# Patient Record
Sex: Male | Born: 1960 | Race: White | Hispanic: No | Marital: Married | Smoking: Never smoker
Health system: Southern US, Community
[De-identification: ages and names within clinical notes are randomized; demographics above are authoritative.]

## PROBLEM LIST (undated history)

## (undated) DIAGNOSIS — Z789 Other specified health status: Secondary | ICD-10-CM

## (undated) HISTORY — DX: Other specified health status: Z78.9

---

## 2008-09-23 ENCOUNTER — Encounter: Admission: RE | Admit: 2008-09-23 | Discharge: 2008-09-23 | Payer: Self-pay | Admitting: Internal Medicine

## 2008-09-23 ENCOUNTER — Encounter: Payer: Self-pay | Admitting: Internal Medicine

## 2008-10-13 ENCOUNTER — Ambulatory Visit: Payer: Self-pay | Admitting: Internal Medicine

## 2008-10-13 ENCOUNTER — Encounter: Payer: Self-pay | Admitting: Internal Medicine

## 2008-10-19 ENCOUNTER — Telehealth: Payer: Self-pay | Admitting: Internal Medicine

## 2008-10-20 ENCOUNTER — Telehealth (INDEPENDENT_AMBULATORY_CARE_PROVIDER_SITE_OTHER): Payer: Self-pay | Admitting: *Deleted

## 2008-10-21 DIAGNOSIS — J45909 Unspecified asthma, uncomplicated: Secondary | ICD-10-CM | POA: Insufficient documentation

## 2008-10-27 ENCOUNTER — Telehealth (INDEPENDENT_AMBULATORY_CARE_PROVIDER_SITE_OTHER): Payer: Self-pay | Admitting: *Deleted

## 2008-10-28 ENCOUNTER — Ambulatory Visit: Payer: Self-pay | Admitting: Internal Medicine

## 2008-10-31 ENCOUNTER — Telehealth: Payer: Self-pay | Admitting: Pulmonary Disease

## 2008-10-31 LAB — CONVERTED CEMR LAB: TSH: 1.63 microintl units/mL (ref 0.35–5.50)

## 2008-11-15 ENCOUNTER — Encounter: Payer: Self-pay | Admitting: Internal Medicine

## 2008-12-01 ENCOUNTER — Telehealth: Payer: Self-pay | Admitting: Internal Medicine

## 2008-12-07 ENCOUNTER — Ambulatory Visit: Payer: Self-pay | Admitting: Internal Medicine

## 2011-02-19 HISTORY — PX: COLONOSCOPY: SHX174

## 2011-12-21 ENCOUNTER — Emergency Department (HOSPITAL_COMMUNITY): Payer: BC Managed Care – PPO

## 2011-12-21 ENCOUNTER — Encounter (HOSPITAL_COMMUNITY): Payer: Self-pay | Admitting: *Deleted

## 2011-12-21 ENCOUNTER — Emergency Department (HOSPITAL_COMMUNITY)
Admission: EM | Admit: 2011-12-21 | Discharge: 2011-12-21 | Disposition: A | Discharge status: Home / Self Care | Payer: BC Managed Care – PPO | Attending: Emergency Medicine | Admitting: Emergency Medicine

## 2011-12-21 DIAGNOSIS — W278XXA Contact with other nonpowered hand tool, initial encounter: Secondary | ICD-10-CM | POA: Insufficient documentation

## 2011-12-21 DIAGNOSIS — Y9389 Activity, other specified: Secondary | ICD-10-CM | POA: Insufficient documentation

## 2011-12-21 DIAGNOSIS — Y929 Unspecified place or not applicable: Secondary | ICD-10-CM | POA: Insufficient documentation

## 2011-12-21 DIAGNOSIS — S61409A Unspecified open wound of unspecified hand, initial encounter: Secondary | ICD-10-CM | POA: Insufficient documentation

## 2011-12-21 DIAGNOSIS — S61411A Laceration without foreign body of right hand, initial encounter: Secondary | ICD-10-CM

## 2011-12-21 MED ORDER — HYDROCODONE-ACETAMINOPHEN 5-500 MG PO TABS: 1.0000 | ORAL_TABLET | Freq: Four times a day (QID) | ORAL | Status: DC | PRN

## 2011-12-21 NOTE — ED Notes (Signed)
Pt has laceration to right thumb, states was using a grinder cutting a piece of metal when the metal was caught and "slung the grinder." bleeding controlled. Wound edges approximated. Pt able to move thumb and other fingers.

## 2011-12-21 NOTE — ED Provider Notes (Signed)
Medical screening examination/treatment/procedure(s) were performed by non-physician practitioner and as supervising physician I was immediately available for consultation/collaboration.  Elysha Daw, MD 12/21/11 2335 

## 2011-12-21 NOTE — ED Provider Notes (Signed)
History     CSN: [id number]  Arrival date & time 12/21/11  6440   First MD Initiated Contact with Patient 12/21/11 2029      Chief Complaint  Patient presents with  . Extremity Laceration    (Consider location/radiation/quality/duration/timing/severity/associated sxs/prior treatment) HPI  51 year old male presents for evaluations of a laceration to his right hand. Patient reports. Using a grinder to cut a piece of metal today when he lost control and cut his R hand instead.  Incident happened 3 hrs ago.  No treatment received yet.  Onset acute, sharp, throbbing, non radiating, moderate in severity, no numbness.  No other injury.  Tetanus is UTD.  R hand dominant.    History reviewed. No pertinent past medical history.  History reviewed. No pertinent past surgical history.  History reviewed. No pertinent family history.  History  Substance Use Topics  . Smoking status: Not on file  . Smokeless tobacco: Not on file  . Alcohol Use: Yes      Review of Systems  Constitutional: Negative for fever.  Musculoskeletal: Negative for joint swelling.  Skin: Positive for wound.  Neurological: Negative for numbness.    Allergies  Penicillins  Home Medications   Current Outpatient Rx  Name Route Sig Dispense Refill  . IBUPROFEN 200 MG PO TABS Oral Take 400 mg by mouth every 6 (six) hours as needed. Pain      BP 112/70  Pulse 73  Temp 97.9 F (36.6 C) (Oral)  Resp 18  Ht 6\' 3"  (1.905 m)  Wt 178 lb (80.74 kg)  BMI 22.25 kg/m2  SpO2 97%  Physical Exam  Nursing note and vitals reviewed. Constitutional: He appears well-developed and well-nourished. No distress.  HENT:  Head: Atraumatic.  Eyes: Conjunctivae normal are normal.  Neck: Neck supple.  Musculoskeletal:       3cm superficial laceration noted to dorsum of R hand proximal to 1st MCP with no joint involvement.  Normal sensation distally, normal strength to all fingers to both flexion and extension.  No fb seen or  palpated  Neurological: He is alert.  Skin: Skin is warm. No rash noted.  Psychiatric: He has a normal mood and affect.    ED Course  Procedures (including critical care time)  Labs Reviewed - No data to display Dg Finger Thumb Right  12/21/2011  *RADIOLOGY REPORT*  Clinical Data: Laceration, pain  RIGHT THUMB 2+V  Comparison: None.  Findings: There is no fracture or dislocation.  There is dorsal soft tissue swelling.  A rounded density is seen dorsal to the distal metacarpal cortex.  This density is not clearly metallic but could represent some time of radiodense foreign body.  Correlate clinically.  IMPRESSION: No fracture or dislocation.  Soft tissue swelling.  Possible radiopaque foreign body in the dorsal soft tissues near the first metacarpal head (arrow).   Original Report Authenticated By: Davonna Belling, M.D.      No diagnosis found.  LACERATION REPAIR Performed by: Fayrene Helper Authorized byFayrene Helper Consent: Verbal consent obtained. Risks and benefits: risks, benefits and alternatives were discussed Consent given by: patient Patient identity confirmed: provided demographic data Prepped and Draped in normal sterile fashion Wound explored  Laceration Location: R hand, dorsum  Laceration Length: 3cm  No Foreign Bodies seen or palpated  Anesthesia: local infiltration  Local anesthetic: lidocaine 2% w/out epinephrine  Anesthetic total: 3 ml  Irrigation method: syringe Amount of cleaning: standard  Skin closure: prolene 5.0  Number of sutures: 6  Technique: simple  interrupted  Patient tolerance: Patient tolerated the procedure well with no immediate complications.  1. R hand laceration  MDM  Pt with R hand laceration without tendon or bony involvement. NVI. Xray shows no fx but does show a small radiopaque fb near injury.  Wound were thoroughly irrigated.  No fb seen or palpated.  Wound were sutured successfully.  Pt aware that there are always a chance of  retain object.  Hand referral given as needed.  Care instruction given.    BP 112/70  Pulse 73  Temp 97.9 F (36.6 C) (Oral)  Resp 18  Ht 6\' 3"  (1.905 m)  Wt 178 lb (80.74 kg)  BMI 22.25 kg/m2  SpO2 97%  I have reviewed nursing notes and vital signs. I personally reviewed the imaging tests through PACS system  I reviewed available ER/hospitalization records thought the EMR        Fayrene Helper, [place] 12/21/11 2120

## 2011-12-21 NOTE — ED Notes (Signed)
Wound dressed with bacitracin, Telfa gauze, and coban.  

## 2012-09-17 ENCOUNTER — Ambulatory Visit (INDEPENDENT_AMBULATORY_CARE_PROVIDER_SITE_OTHER): Payer: Managed Care, Other (non HMO) | Admitting: Otolaryngology

## 2012-09-17 DIAGNOSIS — H6120 Impacted cerumen, unspecified ear: Secondary | ICD-10-CM

## 2012-09-17 DIAGNOSIS — H903 Sensorineural hearing loss, bilateral: Secondary | ICD-10-CM

## 2014-01-07 IMAGING — CR DG FINGER THUMB 2+V*R*
3 series · 3 of 3 positions shown · non-contrast
Comparison: None.

CLINICAL DATA: Laceration, pain

RIGHT THUMB 2+V

[x finger pa right]
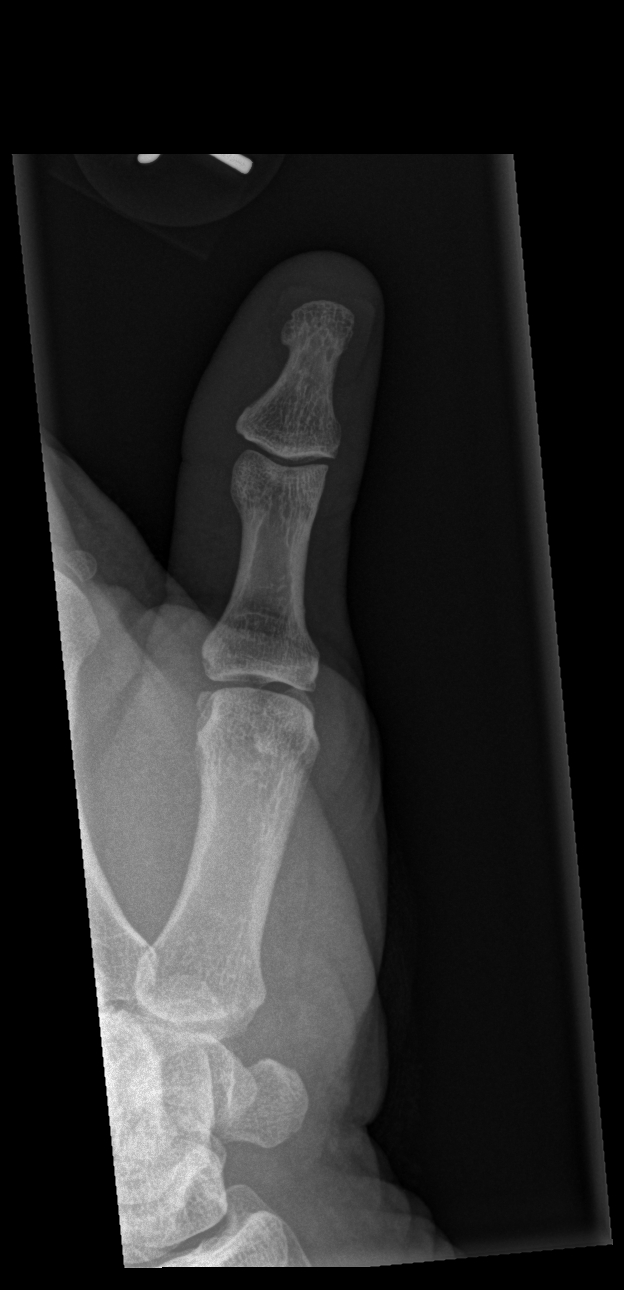

[x finger obl right]
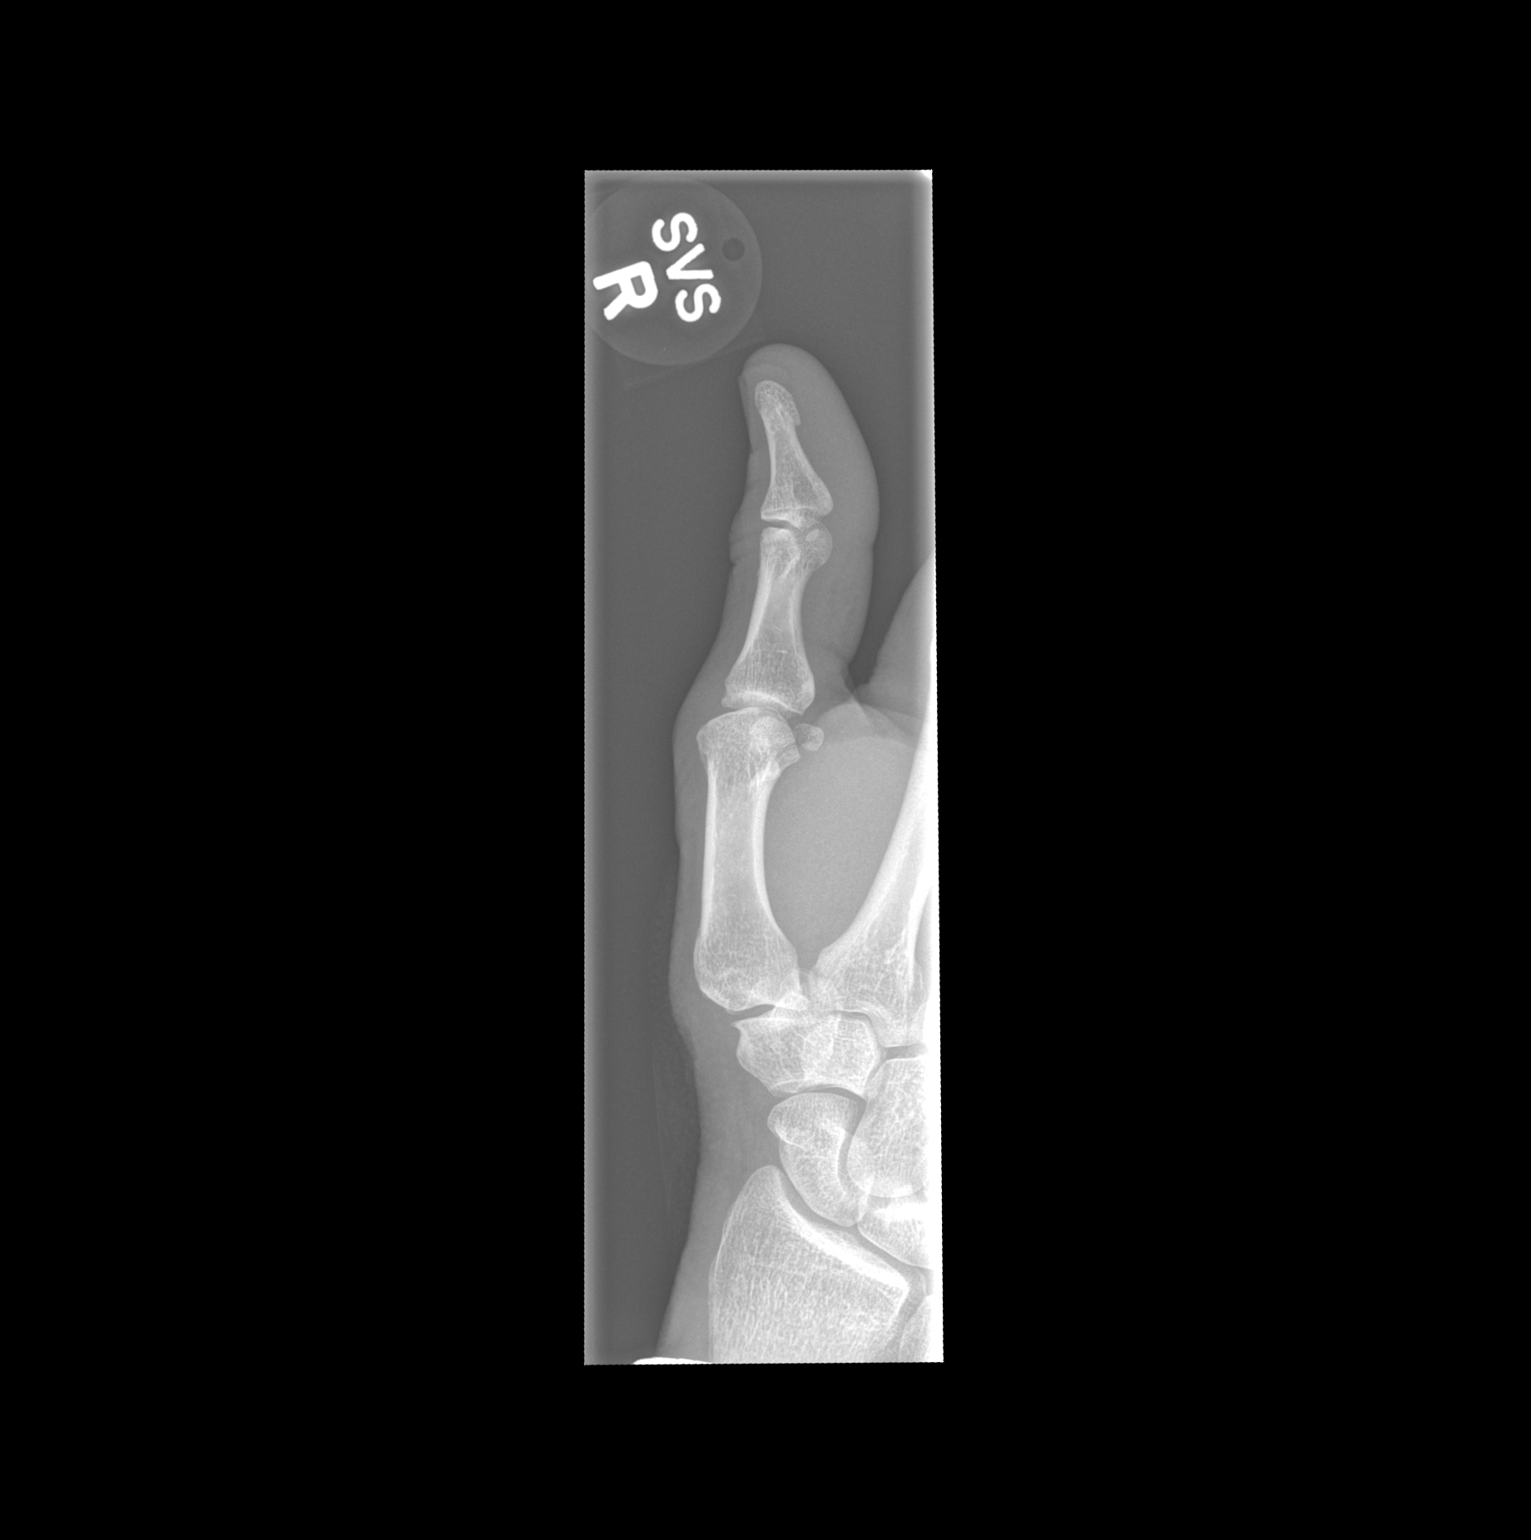

[x finger lat right]
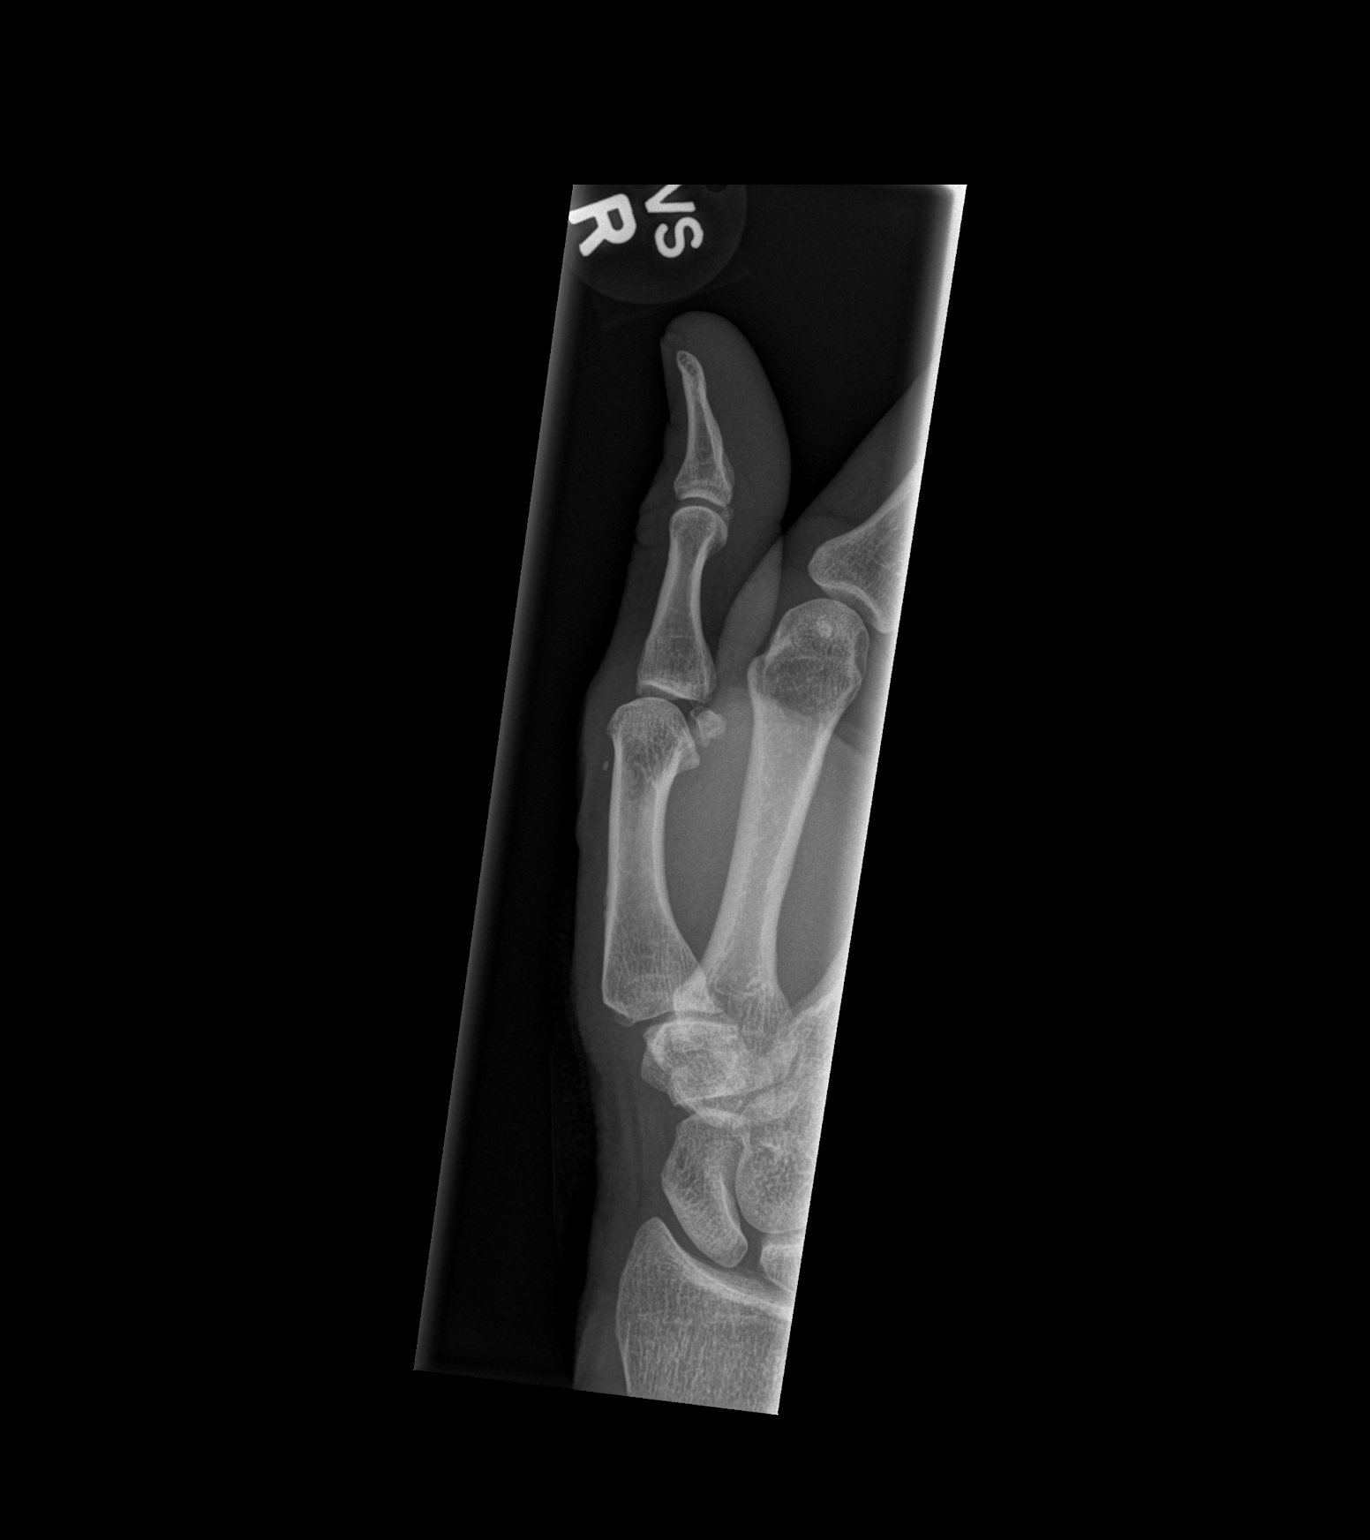

[3 of 3 positions shown; findings below may reference images not displayed]

FINDINGS: There is no fracture or dislocation.  There is dorsal
soft tissue swelling.  A rounded density is seen dorsal to the
distal metacarpal cortex.  This density is not clearly metallic but
could represent some time of radiodense foreign body.  Correlate
clinically.
IMPRESSION: No fracture or dislocation.  Soft tissue swelling.  Possible
radiopaque foreign body in the dorsal soft tissues near the first
metacarpal head (arrow).

## 2021-04-26 ENCOUNTER — Encounter: Payer: Self-pay | Admitting: Gastroenterology

## 2021-05-01 ENCOUNTER — Telehealth: Payer: Self-pay

## 2021-05-01 ENCOUNTER — Ambulatory Visit: Payer: BC Managed Care – PPO

## 2021-05-01 NOTE — Telephone Encounter (Signed)
Multiple attempts made to reach patient for PV appt;  no answer and not able to leave a voicemail; will attempt to reach patient by end of business day to reschedule PV appt;  ? ?If not able to reach patient- a no show letter will be mailed to the patient, PV and procedure appts will be cancelled;  ?

## 2021-05-02 ENCOUNTER — Other Ambulatory Visit: Payer: Self-pay

## 2021-05-02 ENCOUNTER — Ambulatory Visit (AMBULATORY_SURGERY_CENTER): Payer: BC Managed Care – PPO

## 2021-05-02 VITALS — Ht 75.0 in | Wt 155.0 lb

## 2021-05-02 DIAGNOSIS — Z1211 Encounter for screening for malignant neoplasm of colon: Secondary | ICD-10-CM

## 2021-05-02 MED ORDER — NA SULFATE-K SULFATE-MG SULF 17.5-3.13-1.6 GM/177ML PO SOLN: 1.0000 | Freq: Once | ORAL | 0 refills | Status: AC

## 2021-05-02 NOTE — Progress Notes (Signed)
No egg or soy allergy known to patient  ?No issues known to pt with past sedation with any surgeries or procedures ?Patient denies ever being told they had issues or difficulty with intubation  ?No FH of Malignant Hyperthermia ?Pt is not on diet pills ?Pt is not on home 02  ?Pt is not on blood thinners  ?Pt denies issues with constipation  ?No A fib or A flutter ?Pt is fully vaccinated for Covid x 2; ?NO PA's for preps discussed with pt in PV today  ?Discussed with pt there will be an out-of-pocket cost for prep and that varies from $0 to 70 + dollars - pt verbalized understanding  ?Due to the COVID-19 pandemic we are asking patients to follow certain guidelines in PV and the [facility]   ?Pt aware of COVID protocols and [facility] guidelines  ?PV completed over the phone. Pt verified name, DOB, address and insurance during PV today.  ?Pt mailed instruction packet with copy of consent form to read and not return, and instructions.  ?Pt encouraged to call with questions or issues.  ?If pt has My chart, procedure instructions sent via My Chart  ? ?

## 2021-05-03 ENCOUNTER — Encounter: Payer: Self-pay | Admitting: Gastroenterology

## 2021-05-14 ENCOUNTER — Encounter: Payer: Self-pay | Admitting: Gastroenterology

## 2021-05-14 ENCOUNTER — Ambulatory Visit (AMBULATORY_SURGERY_CENTER): Payer: BC Managed Care – PPO | Admitting: Gastroenterology

## 2021-05-14 VITALS — BP 91/67 | HR 56 | Temp 98.0°F | Resp 16 | Ht 75.0 in | Wt 155.0 lb

## 2021-05-14 DIAGNOSIS — Z1211 Encounter for screening for malignant neoplasm of colon: Secondary | ICD-10-CM

## 2021-05-14 DIAGNOSIS — D123 Benign neoplasm of transverse colon: Secondary | ICD-10-CM

## 2021-05-14 MED ORDER — SODIUM CHLORIDE 0.9 % IV SOLN: 500.0000 mL | Freq: Once | INTRAVENOUS | Status: DC

## 2021-05-14 NOTE — Progress Notes (Signed)
Called to room to assist during endoscopic procedure.  Patient ID and intended procedure confirmed with present staff. Received instructions for my participation in the procedure from the performing physician.  

## 2021-05-14 NOTE — Progress Notes (Signed)
Pt's states no medical or surgical changes since previsit or office visit. 

## 2021-05-14 NOTE — Progress Notes (Signed)
Sedate, gd SR, tolerated procedure well, VSS, report to RN 

## 2021-05-14 NOTE — Op Note (Signed)
[facility] ?Patient Name: Miguel Landry ?Procedure Date: 05/14/2021 1:39 PM ?MRN: [id number] ?Endoscopist: Mauri Pole , MD ?Age: 61 ?Referring MD:  ?Date of Birth: [birth year] ?Gender: Male ?Account #: [id number] ?Procedure:                Colonoscopy ?Indications:              Screening for colorectal malignant neoplasm ?Medicines:                Monitored Anesthesia Care ?Procedure:                Pre-Anesthesia Assessment: ?                          - Prior to the procedure, a History and Physical  ?                          was performed, and patient medications and  ?                          allergies were reviewed. The patient's tolerance of  ?                          previous anesthesia was also reviewed. The risks  ?                          and benefits of the procedure and the sedation  ?                          options and risks were discussed with the patient.  ?                          All questions were answered, and informed consent  ?                          was obtained. Prior Anticoagulants: The patient has  ?                          taken no previous anticoagulant or antiplatelet  ?                          agents. ASA Grade Assessment: II - A patient with  ?                          mild systemic disease. After reviewing the risks  ?                          and benefits, the patient was deemed in  ?                          satisfactory condition to undergo the procedure. ?                          After obtaining informed consent, the colonoscope  ?  was passed under direct vision. Throughout the  ?                          procedure, the patient's blood pressure, pulse, and  ?                          oxygen saturations were monitored continuously. The  ?                          Olympus PCF-H190DL ([id number]) Colonoscope was  ?                          introduced through the anus and advanced to the the  ?                          terminal ileum.  The colonoscopy was performed  ?                          without difficulty. The patient tolerated the  ?                          procedure well. The quality of the bowel  ?                          preparation was excellent. The ileocecal valve,  ?                          appendiceal orifice, and rectum were photographed. ?Scope In: 1:52:25 PM ?Scope Out: 2:07:02 PM ?Scope Withdrawal Time: 0 hours 10 minutes 16 seconds  ?Total Procedure Duration: 0 hours 14 minutes 37 seconds  ?Findings:                 The perianal and digital rectal examinations were  ?                          normal. ?                          A 5 mm polyp was found in the transverse colon. The  ?                          polyp was sessile. The polyp was removed with a  ?                          cold snare. Resection and retrieval were complete. ?                          Non-bleeding external and internal hemorrhoids were  ?                          found during retroflexion. The hemorrhoids were  ?                          small. ?Complications:  No immediate complications. ?Estimated Blood Loss:     Estimated blood loss was minimal. ?Impression:               - One 5 mm polyp in the transverse colon, removed  ?                          with a cold snare. Resected and retrieved. ?                          - Non-bleeding external and internal hemorrhoids. ?Recommendation:           - Patient has a contact number available for  ?                          emergencies. The signs and symptoms of potential  ?                          delayed complications were discussed with the  ?                          patient. Return to normal activities tomorrow.  ?                          Written discharge instructions were provided to the  ?                          patient. ?                          - Resume previous diet. ?                          - Continue present medications. ?                          - Await pathology results. ?                           - Repeat colonoscopy in 5-10 years for surveillance  ?                          based on pathology results. ?Mauri Pole, MD ?05/14/2021 2:14:02 PM ?This report has been signed electronically. ?

## 2021-05-14 NOTE — Progress Notes (Signed)
D.T. vital signs. °

## 2021-05-14 NOTE — Patient Instructions (Signed)
Handout given for polyps. ? ?YOU HAD AN ENDOSCOPIC PROCEDURE TODAY AT [facility]:   Refer to the procedure report that was given to you for any specific questions about what was found during the examination.  If the procedure report does not answer your questions, please call your gastroenterologist to clarify.  If you requested that your care partner not be given the details of your procedure findings, then the procedure report has been included in a sealed envelope for you to review at your convenience later. ? ?YOU SHOULD EXPECT: Some feelings of bloating in the abdomen. Passage of more gas than usual.  Walking can help get rid of the air that was put into your GI tract during the procedure and reduce the bloating. If you had a lower endoscopy (such as a colonoscopy or flexible sigmoidoscopy) you may notice spotting of blood in your stool or on the toilet paper. If you underwent a bowel prep for your procedure, you may not have a normal bowel movement for a few days. ? ?Please Note:  You might notice some irritation and congestion in your nose or some drainage.  This is from the oxygen used during your procedure.  There is no need for concern and it should clear up in a day or so. ? ?SYMPTOMS TO REPORT IMMEDIATELY: ? ?Following lower endoscopy (colonoscopy): ? Excessive amounts of blood in the stool ? Significant tenderness or worsening of abdominal pains ? Swelling of the abdomen that is new, acute ? Fever of 100?F or higher ? ?For urgent or emergent issues, a gastroenterologist can be reached at any hour by calling [phone]. ?Do not use MyChart messaging for urgent concerns.  ? ? ?DIET:  We do recommend a small meal at first, but then you may proceed to your regular diet.  Drink plenty of fluids but you should avoid alcoholic beverages for 24 hours. ? ?ACTIVITY:  You should plan to take it easy for the rest of today and you should NOT DRIVE or use heavy machinery until tomorrow (because  of the sedation medicines used during the test).   ? ?FOLLOW UP: ?Our staff will call the number listed on your records 48-72 hours following your procedure to check on you and address any questions or concerns that you may have regarding the information given to you following your procedure. If we do not reach you, we will leave a message.  We will attempt to reach you two times.  During this call, we will ask if you have developed any symptoms of COVID 19. If you develop any symptoms (ie: fever, flu-like symptoms, shortness of breath, cough etc.) before then, please call [phone].  If you test positive for Covid 19 in the 2 weeks post procedure, please call and report this information to [place].   ? ?If any biopsies were taken you will be contacted by phone or by letter within the next 1-2 weeks.  Please call us at [phone] if you have not heard about the biopsies in 3 weeks.  ? ? ?SIGNATURES/CONFIDENTIALITY: ?You and/or your care partner have signed paperwork which will be entered into your electronic medical record.  These signatures attest to the fact that that the information above on your After Visit Summary has been reviewed and is understood.  Full responsibility of the confidentiality of this discharge information lies with you and/or your care-partner.  ?

## 2021-05-14 NOTE — Progress Notes (Signed)
[facility] History and Physical ? ? ?Primary Care Physician:  Deland Pretty, MD ? ? ?Reason for Procedure:  Colorectal cancer screening ? ?Plan:    Screening colonoscopy with possible interventions as needed ? ? ? ? ?HPI: Miguel Landry is a very pleasant 61 y.o. male here for screening colonoscopy. ?Denies any nausea, vomiting, abdominal pain, melena or bright red blood per rectum ? ?The risks and benefits as well as alternatives of endoscopic procedure(s) have been discussed and reviewed. All questions answered. The patient agrees to proceed. ? ? ? ?Past Medical History:  ?Diagnosis Date  ? No pertinent past medical history   ? ? ?Past Surgical History:  ?Procedure Laterality Date  ? COLONOSCOPY  2013  ? at [facility]- "poor prep"  ? ? ?Prior to Admission medications   ?Not on File  ? ? ?No current outpatient medications on file.  ? ?Current Facility-Administered Medications  ?Medication Dose Route Frequency Provider Last Rate Last Admin  ? 0.9 %  sodium chloride infusion  500 mL Intravenous Once Brigg Cape, Venia Minks, MD      ? ? ?Allergies as of 05/14/2021 - Review Complete 05/14/2021  ?Allergen Reaction Noted  ? Penicillins Swelling   ? Penicillin g Rash 01/02/2021  ? ? ?Family History  ?Problem Relation Age of Onset  ? Colon polyps Neg Hx   ? Colon cancer Neg Hx   ? Esophageal cancer Neg Hx   ? Stomach cancer Neg Hx   ? Rectal cancer Neg Hx   ? ? ?Social History  ? ?Socioeconomic History  ? Marital status: Married  ?  Spouse name: Not on file  ? Number of children: Not on file  ? Years of education: Not on file  ? Highest education level: Not on file  ?Occupational History  ? Not on file  ?Tobacco Use  ? Smoking status: Never  ? Smokeless tobacco: Never  ?Vaping Use  ? Vaping Use: Never used  ?Substance and Sexual Activity  ? Alcohol use: Yes  ?  Comment: sporatic  ? Drug use: No  ? Sexual activity: Not on file  ?Other Topics Concern  ? Not on file  ?Social History Narrative  ? Not on  file  ? ?Social Determinants of Health  ? ?Financial Resource Strain: Not on file  ?Food Insecurity: Not on file  ?Transportation Needs: Not on file  ?Physical Activity: Not on file  ?Stress: Not on file  ?Social Connections: Not on file  ?Intimate Partner Violence: Not on file  ? ? ?Review of Systems: ? ?All other review of systems negative except as mentioned in the HPI. ? ?Physical Exam: ?Vital signs in last 24 hours: ?BP 104/63   Pulse 64   Temp 98 ?F (36.7 ?C)   Ht '6\' 3"'$  (1.905 m)   Wt 155 lb (70.3 kg)   SpO2 97%   BMI 19.37 kg/m?  ?General:   Alert, NAD ?Lungs:  Clear .   ?Heart:  Regular rate and rhythm ?Abdomen:  Soft, nontender and nondistended. ?Neuro/Psych:  Alert and cooperative. Normal mood and affect. A and O x 3 ? ?Reviewed labs, radiology imaging, old records and pertinent past GI work up ? ?Patient is appropriate for planned procedure(s) and anesthesia in an ambulatory setting ? ? ?K. Denzil Magnuson , MD ?[phone]  ? ? ?  ?

## 2021-05-16 ENCOUNTER — Telehealth: Payer: Self-pay

## 2021-05-16 NOTE — Telephone Encounter (Signed)
?  Follow up Call- ? ? ?  05/14/2021  ?  1:11 PM  ?Call back number  ?Post procedure Call Back phone  # [phone]  ?Permission to leave phone message Yes  ?  ? ?Patient questions: ? ?Do you have a fever, pain , or abdominal swelling? No. ?Pain Score  0 * ? ?Have you tolerated food without any problems? Yes.   ? ?Have you been able to return to your normal activities? Yes.   ? ?Do you have any questions about your discharge instructions: ?Diet   No. ?Medications  No. ?Follow up visit  No. ? ?Do you have questions or concerns about your Care? No. ? ?Actions: ?* If pain score is 4 or above: ?No action needed, pain <4. ? ? ?

## 2021-05-17 ENCOUNTER — Encounter: Payer: Self-pay | Admitting: Gastroenterology

## 2021-05-18 ENCOUNTER — Telehealth: Payer: Self-pay | Admitting: Gastroenterology

## 2021-05-18 NOTE — Telephone Encounter (Signed)
Inbound call from patient wife stating that he can not get into his mychart and is requesting when the results of his colonoscopy procedure come back that the results be sent in the mail. Please advise.  ?

## 2021-05-21 NOTE — Telephone Encounter (Signed)
Results are typically mailed in 1 to 2 weeks.  ?

## 2022-06-13 ENCOUNTER — Emergency Department (HOSPITAL_COMMUNITY)
Admission: EM | Admit: 2022-06-13 | Discharge: 2022-06-14 | Disposition: A | Discharge status: Home / Self Care | Payer: BC Managed Care – PPO | Attending: Emergency Medicine | Admitting: Emergency Medicine

## 2022-06-13 DIAGNOSIS — R519 Headache, unspecified: Secondary | ICD-10-CM | POA: Insufficient documentation

## 2022-06-13 DIAGNOSIS — R1110 Vomiting, unspecified: Secondary | ICD-10-CM | POA: Diagnosis not present

## 2022-06-13 DIAGNOSIS — R55 Syncope and collapse: Secondary | ICD-10-CM | POA: Diagnosis present

## 2022-06-14 ENCOUNTER — Encounter (HOSPITAL_COMMUNITY): Payer: Self-pay

## 2022-06-14 ENCOUNTER — Emergency Department (HOSPITAL_COMMUNITY): Payer: BC Managed Care – PPO

## 2022-06-14 ENCOUNTER — Other Ambulatory Visit: Payer: Self-pay

## 2022-06-14 LAB — BASIC METABOLIC PANEL
Anion gap: 9 (ref 5–15)
BUN: 13 mg/dL (ref 8–23)
CO2: 23 mmol/L (ref 22–32)
Calcium: 7.9 mg/dL — ABNORMAL LOW (ref 8.9–10.3)
Chloride: 103 mmol/L (ref 98–111)
Creatinine, Ser: 0.82 mg/dL (ref 0.61–1.24)
GFR, Estimated: >60 mL/min (ref >60)
Glucose, Bld: 110 mg/dL — ABNORMAL HIGH (ref 70–99)
Potassium: 3.6 mmol/L (ref 3.5–5.1)
Sodium: 135 mmol/L (ref 135–145)

## 2022-06-14 LAB — CBC
HCT: 37.2 % — ABNORMAL LOW (ref 39.0–52.0)
Hemoglobin: 12.6 g/dL — ABNORMAL LOW (ref 13.0–17.0)
MCH: 31.6 pg (ref 26.0–34.0)
MCHC: 33.9 g/dL (ref 30.0–36.0)
MCV: 93.2 fL (ref 80.0–100.0)
Platelets: 257 10*3/uL (ref 150–400)
RBC: 3.99 MIL/uL — ABNORMAL LOW (ref 4.22–5.81)
RDW: 13.2 % (ref 11.5–15.5)
WBC: 6.2 10*3/uL (ref 4.0–10.5)
nRBC: 0 % (ref 0.0–0.2)

## 2022-06-14 MED ORDER — SODIUM CHLORIDE 0.9 % IV BOLUS
1000.0000 mL | Freq: Once | INTRAVENOUS | Status: AC
  Administered 2022-06-14: 1000 mL via INTRAVENOUS

## 2022-06-14 MED ORDER — ONDANSETRON 4 MG PO TBDP: ORAL_TABLET | ORAL | 0 refills | Status: AC

## 2022-06-14 NOTE — Discharge Instructions (Signed)
Please call your doctor today and let them know about your visit to the emergency department.  Please return for any change to your symptoms especially if you develop chest pain difficulty breathing worsening headache one-sided numbness or weakness difficulty speech or swallowing or if you pass out again.
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
## 2022-06-14 NOTE — ED Provider Notes (Signed)
[place] EMERGENCY DEPARTMENT AT [facility] Provider Note   CSN: [id number] Arrival date & time: 06/13/22  2357     History  Chief Complaint  Patient presents with   Loss of Consciousness    Miguel Landry is a 62 y.o. male.  [name] with a chief complaints of what thought to be a syncopal event.  The patient said he had a few drinks tonight since he does not normally drink and ended up getting sick to his stomach and threw up.  He then went to the backyard and when people went to check on him found him unconscious.  EMS was called and upon their arrival he was awake and mildly hypotensive.  Improved without any intervention.  Patient tells me he feels fine now.  Been feeling a bit crummy over the past couple days.  He denies specific symptoms.  He says that his head hurts a little bit now.  He feels also little bit out of it.  He denies any chest pain or pressure.  Denies difficulty breathing.   Loss of Consciousness      Home Medications Prior to Admission medications   Medication Sig Start Date End Date Taking? Authorizing Provider  ondansetron (ZOFRAN-ODT) 4 MG disintegrating tablet 4mg  ODT q4 hours prn nausea/vomit 06/14/22  Yes Miguel Plan, DO      Allergies    Penicillins and Penicillin g    Review of Systems   Review of Systems  Cardiovascular:  Positive for syncope.    Physical Exam Updated Vital Signs BP 102/71   Pulse 60   Temp 97.6 F (36.4 C)   Resp 14   SpO2 96%  Physical Exam Vitals and nursing note reviewed.  Constitutional:      Appearance: He is well-developed.  HENT:     Head: Normocephalic and atraumatic.  Eyes:     Pupils: Pupils are equal, round, and reactive to light.  Neck:     Vascular: No JVD.  Cardiovascular:     Rate and Rhythm: Normal rate and regular rhythm.     Heart sounds: No murmur heard.    No friction rub. No gallop.  Pulmonary:     Effort: No respiratory distress.     Breath sounds: No wheezing.  Abdominal:      General: There is no distension.     Tenderness: There is no abdominal tenderness. There is no guarding or rebound.  Musculoskeletal:        General: Normal range of motion.     Cervical back: Normal range of motion and neck supple.  Skin:    Coloration: Skin is not pale.     Findings: No rash.  Neurological:     Mental Status: He is alert and oriented to person, place, and time.  Psychiatric:        Behavior: Behavior normal.     ED Results / Procedures / Treatments   Labs (all labs ordered are listed, but only abnormal results are displayed) Labs Reviewed  BASIC METABOLIC PANEL - Abnormal; Notable for the following components:      Result Value   Glucose, Bld 110 (*)    Calcium 7.9 (*)    All other components within normal limits  CBC - Abnormal; Notable for the following components:   RBC 3.99 (*)    Hemoglobin 12.6 (*)    HCT 37.2 (*)    All other components within normal limits  URINALYSIS, ROUTINE W REFLEX MICROSCOPIC  CBG MONITORING, ED    EKG EKG Interpretation  Date/Time:  Friday June 14 2022 00:22:13 EDT Ventricular Rate:  57 PR Interval:  177 QRS Duration: 98 QT Interval:  491 QTC Calculation: 479 R Axis:   69 Text Interpretation: Sinus rhythm Borderline prolonged QT interval No old tracing to compare Confirmed by Miguel Landry [phone]) on 06/14/2022 12:38:39 AM  Radiology CT Head Wo Contrast  Result Date: 06/14/2022 CLINICAL DATA:  Headache EXAM: CT HEAD WITHOUT CONTRAST TECHNIQUE: Contiguous axial images were obtained from the base of the skull through the vertex without intravenous contrast. RADIATION DOSE REDUCTION: This exam was performed according to the departmental dose-optimization program which includes automated exposure control, adjustment of the mA and/or kV according to patient size and/or use of iterative reconstruction technique. COMPARISON:  None Available. FINDINGS: Brain: No acute intracranial abnormality. Specifically, no hemorrhage,  hydrocephalus, mass lesion, acute infarction, or significant intracranial injury. Vascular: No hyperdense vessel or unexpected calcification. Skull: No acute calvarial abnormality. Sinuses/Orbits: No acute findings Other: None IMPRESSION: Normal study. Electronically Signed   By: Charlett Nose M.D.   On: 06/14/2022 01:02    Procedures .1-3 Lead EKG Interpretation  Performed by: Miguel Plan, DO Authorized by: Miguel Plan, DO     Interpretation: normal     ECG rate:  62   ECG rate assessment: normal     Rhythm: sinus rhythm     Ectopy: none     Conduction: normal       Medications Ordered in ED Medications  sodium chloride 0.9 % bolus 1,000 mL (0 mLs Intravenous Stopped 06/14/22 0228)    ED Course/ Medical Decision Making/ A&P                             Medical Decision Making Amount and/or Complexity of Data Reviewed Labs: ordered. Radiology: ordered.  Risk Prescription drug management.   61 yo M with a chief complaint of the event where he lost consciousness.  By history it sounds possibly vasovagal, had an episode of emesis and then lost consciousness.  Initially hypotensive with EMS and improved without intervention.  Will obtain a laboratory evaluation to assess for acute anemia or acute electrolyte abnormality.  He is describing a mild headache so obtain CT imaging of the head.  Bolus of IV fluids.  Reassess.  CT scan of the head without acute intracranial pathology.  No significant anemia, no significant electrolyte abnormality.  Patient reassessed and continues to feel well and like to go home.  I was notified by the patient's daughter that he had actually been on the ground for a couple hours prior to EMS being called.  At that time he was very confused.  Based on that history seems less likely to be vagal, I am not sure the exact etiology of these symptoms.  I did discuss this with the patient who would like to go home at this time.  2:43 AM:  I have discussed the  diagnosis/risks/treatment options with the patient and family.  Evaluation and diagnostic testing in the emergency department does not suggest an emergent condition requiring admission or immediate intervention beyond what has been performed at this time.  They will follow up with PCP. We also discussed returning to the ED immediately if new or worsening sx occur. We discussed the sx which are most concerning (e.g., sudden worsening pain, fever, inability to tolerate by mouth) that necessitate immediate return. Medications administered to the  patient during their visit and any new prescriptions provided to the patient are listed below.  Medications given during this visit Medications  sodium chloride 0.9 % bolus 1,000 mL (0 mLs Intravenous Stopped 06/14/22 0228)     The patient appears reasonably screen and/or stabilized for discharge and I doubt any other medical condition or other [facility] requiring further screening, evaluation, or treatment in the ED at this time prior to discharge.           Final Clinical Impression(s) / ED Diagnoses Final diagnoses:  Syncope and collapse    Rx / DC Orders ED Discharge Orders          Ordered    ondansetron (ZOFRAN-ODT) 4 MG disintegrating tablet        06/14/22 0241              Miguel Plan, DO 06/14/22 [phone]

## 2023-01-13 ENCOUNTER — Other Ambulatory Visit: Payer: Self-pay | Admitting: Internal Medicine

## 2023-01-13 DIAGNOSIS — N138 Other obstructive and reflux uropathy: Secondary | ICD-10-CM

## 2023-02-28 ENCOUNTER — Ambulatory Visit (HOSPITAL_COMMUNITY)
Admission: RE | Admit: 2023-02-28 | Discharge: 2023-02-28 | Disposition: A | Discharge status: Home / Self Care | Payer: BC Managed Care – PPO | Source: Ambulatory Visit | Attending: Internal Medicine | Admitting: Internal Medicine

## 2023-02-28 DIAGNOSIS — N401 Enlarged prostate with lower urinary tract symptoms: Secondary | ICD-10-CM | POA: Insufficient documentation

## 2023-02-28 DIAGNOSIS — N138 Other obstructive and reflux uropathy: Secondary | ICD-10-CM | POA: Insufficient documentation

## 2024-01-19 ENCOUNTER — Other Ambulatory Visit: Payer: Self-pay | Admitting: Internal Medicine

## 2024-01-19 DIAGNOSIS — D3610 Benign neoplasm of peripheral nerves and autonomic nervous system, unspecified: Secondary | ICD-10-CM

## 2024-02-06 ENCOUNTER — Ambulatory Visit (HOSPITAL_BASED_OUTPATIENT_CLINIC_OR_DEPARTMENT_OTHER)
Admission: RE | Admit: 2024-02-06 | Discharge: 2024-02-06 | Disposition: A | Discharge status: Home / Self Care | Payer: Self-pay | Source: Ambulatory Visit | Attending: Internal Medicine | Admitting: Internal Medicine

## 2024-02-06 DIAGNOSIS — D3610 Benign neoplasm of peripheral nerves and autonomic nervous system, unspecified: Secondary | ICD-10-CM | POA: Insufficient documentation

## 2024-03-09 ENCOUNTER — Ambulatory Visit (INDEPENDENT_AMBULATORY_CARE_PROVIDER_SITE_OTHER): Admitting: Otolaryngology

## 2024-03-09 ENCOUNTER — Encounter (INDEPENDENT_AMBULATORY_CARE_PROVIDER_SITE_OTHER): Payer: Self-pay | Admitting: Otolaryngology

## 2024-03-09 VITALS — BP 113/73 | HR 74 | Wt 180.0 lb

## 2024-03-09 DIAGNOSIS — Z87891 Personal history of nicotine dependence: Secondary | ICD-10-CM | POA: Diagnosis not present

## 2024-03-09 DIAGNOSIS — J358 Other chronic diseases of tonsils and adenoids: Secondary | ICD-10-CM | POA: Diagnosis not present

## 2024-03-09 NOTE — Progress Notes (Signed)
 Dear Dr. Clarice, Here is my assessment for our mutual patient, Miguel Landry. Thank you for allowing me the opportunity to care for your patient. Please do not hesitate to contact me should you have any other questions. Sincerely, Dr. Eldora Blanch  Otolaryngology Clinic Note Referring provider: Dr. Clarice HPI:  Miguel Landry is a 64 y.o. male kindly referred by Dr. Clarice for evaluation of tonsil cyst  Initial visit (02/2024):  Discussed the use of AI scribe software for clinical note transcription with the patient, who gave verbal consent to proceed.  History of Present Illness Miguel Landry is a 64 year old male who presents for evaluation of a suspected tonsillar cyst.  His provider noted a right tonsillar cyst, but he is unaware of a definitive diagnosis. He has not noticed neck swelling or palpable masses and has no odynophagia, dysphagia, otalgia, or dysphonia. He reports no symptoms attributable to the suspected tonsillar cyst.No frequent pharyngitis or prior PTA  Patient otherwise denies: - dysphagia, odynophagia, unintentional weight loss - changes in voice, shortness of breath, hemoptysis tobacco or significant alcohol history - ear pain, neck masses    ENT Surgery: no Personal or FHx of bleeding dz or anesthesia difficulty: no  AP/AC: no  Tobacco: prior, quit  PMHx: Asthma, BPH  Independent Review of Additional Tests or Records:  Dr. Clarice (01/20/2024): thyroid mass v/s cyst ---- wonder if he meant tonsil cyst? -- ref to ENT Labs CBC 01/14/2024: WC 6.0, Eos 100; CMP 01/14/2024: BUN/Cr 11/0.87  PMH/Meds/All/SocHx/FamHx/ROS:   Past Medical History:  Diagnosis Date   No pertinent past medical history      Past Surgical History:  Procedure Laterality Date   COLONOSCOPY  2013   at [facility]- poor prep    Family History  Problem Relation Age of Onset   Colon polyps Neg Hx    Colon cancer Neg Hx    Esophageal cancer Neg Hx    Stomach cancer Neg Hx     Rectal cancer Neg Hx      Social Connections: Not on file     Current Medications[1]   Physical Exam:   BP 113/73 (BP Location: Right Arm, Patient Position: Sitting, Cuff Size: Normal)   Pulse 74   Wt 180 lb (81.6 kg)   SpO2 96%   BMI 22.50 kg/m   Salient findings:  CN II-XII intact Bilateral EAC modest cerumen but visualized TM intact with well pneumatized middle ear spaces Anterior rhinoscopy: Septum intact; bilateral inferior turbinates without significant hypertrophy No lesions of oral cavity/oropharynx -- except small right tonsil mucous retention cyst, no obvious mass on palpation; quite soft;  No obviously palpable neck masses/lymphadenopathy/thyromegaly; thin neck No respiratory distress or stridor  Seprately Identifiable Procedures:  Prior to initiating any procedures, risks/benefits/alternatives were explained to the patient and verbal consent obtained. None  Impression & Plans:  Miguel Landry is a 64 y.o. male with:  1. Mucous cyst of tonsil    Appears to be consistent with mucous retention cyst right superior pole. No other sx. D/w pt re: bx v/s observation and given asx, he opted for observation which is reasonable F/u PRN, return precautions discussed  See below regarding exact medications prescribed this encounter including dosages and route: No orders of the defined types were placed in this encounter.     Thank you for allowing me the opportunity to care for your patient. Please do not hesitate to contact me should you have any other questions.  Sincerely, Eldora Blanch,  MD Otolaryngologist (ENT), [facility] Phone: [phone] Fax: [phone]  03/09/2024, 3:31 PM   MDM:  Level 3 - [phone] Complexity/Problems addressed: low - likely chronic problem, stable Data complexity: low- independent review of note, labs - Morbidity: low  - Prescription Drug prescribed or managed: n      [1]  Current Outpatient Medications:     ondansetron  (ZOFRAN -ODT) 4 MG disintegrating tablet, 4mg  ODT q4 hours prn nausea/vomit (Patient not taking: Reported on 03/09/2024), Disp: 20 tablet, Rfl: 0
